# Patient Record
Sex: Female | Born: 1980 | Hispanic: Yes | Marital: Married | Smoking: Never smoker
Health system: Southern US, Community
[De-identification: ages and names within clinical notes are randomized; demographics above are authoritative.]

---

## 2011-12-03 ENCOUNTER — Encounter: Payer: Self-pay | Admitting: Emergency Medicine

## 2011-12-03 ENCOUNTER — Emergency Department (HOSPITAL_BASED_OUTPATIENT_CLINIC_OR_DEPARTMENT_OTHER)
Admission: EM | Admit: 2011-12-03 | Discharge: 2011-12-03 | Disposition: A | Discharge status: Home / Self Care | Payer: Private Health Insurance - Indemnity | Attending: Emergency Medicine | Admitting: Emergency Medicine

## 2011-12-03 ENCOUNTER — Emergency Department (INDEPENDENT_AMBULATORY_CARE_PROVIDER_SITE_OTHER): Payer: Private Health Insurance - Indemnity

## 2011-12-03 DIAGNOSIS — R059 Cough, unspecified: Secondary | ICD-10-CM | POA: Insufficient documentation

## 2011-12-03 DIAGNOSIS — R05 Cough: Secondary | ICD-10-CM | POA: Insufficient documentation

## 2011-12-03 DIAGNOSIS — J069 Acute upper respiratory infection, unspecified: Secondary | ICD-10-CM | POA: Insufficient documentation

## 2011-12-03 MED ORDER — BENZONATATE 200 MG PO CAPS: 200.0000 mg | ORAL_CAPSULE | Freq: Three times a day (TID) | ORAL | Status: AC | PRN

## 2011-12-03 MED ORDER — BENZONATATE 100 MG PO CAPS
200.0000 mg | ORAL_CAPSULE | Freq: Once | ORAL | Status: AC
  Administered 2011-12-03: 200 mg via ORAL
  Filled 2011-12-03: qty 2

## 2011-12-03 NOTE — ED Notes (Signed)
Patient is resting comfortably. Spouse at bedside

## 2011-12-03 NOTE — ED Provider Notes (Signed)
History     CSN: [id number] Arrival date & time: 12/03/2011 10:35 AM   First MD Initiated Contact with Patient 12/03/11 1146      Chief Complaint  Patient presents with  . Cough    (Consider location/radiation/quality/duration/timing/severity/associated sxs/prior treatment) HPI Patient is a 30 yo F who presents with 3-4 days of cough and sore throat.  She has no known sick exposures.  She denies fevers or history of asthma.  She denies CP.  There are no other associated or modifying factors.  History reviewed. No pertinent past medical history.  History reviewed. No pertinent past surgical history.  History reviewed. No pertinent family history.  History  Substance Use Topics  . Smoking status: Never Smoker   . Smokeless tobacco: Not on file  . Alcohol Use: No    OB History    Grav Para Term Preterm Abortions TAB SAB Ect Mult Living                  Review of Systems  Constitutional: Negative.   HENT: Positive for sore throat.   Eyes: Negative.   Respiratory: Positive for cough.   Cardiovascular: Negative.   Gastrointestinal: Negative.   Musculoskeletal: Negative.   Skin: Negative.   Neurological: Negative.   Hematological: Negative.   Psychiatric/Behavioral: Negative.   All other systems reviewed and are negative.    Allergies  Review of patient's allergies indicates no known allergies.  Home Medications   Current Outpatient Rx  Name Route Sig Dispense Refill  . DM-GUAIFENESIN ER 30-600 MG PO TB12 Oral Take 1 tablet by mouth every 12 (twelve) hours.      Marland Kitchen BENZONATATE 200 MG PO CAPS Oral Take 1 capsule (200 mg total) by mouth 3 (three) times daily as needed for cough. 20 capsule 0    BP 114/84  Pulse 84  Temp(Src) 97.7 F (36.5 C) (Oral)  Resp 18  Ht 5\' 2"  (1.575 m)  Wt 130 lb (58.968 kg)  BMI 23.78 kg/m2  SpO2 98%  Physical Exam  Nursing note and vitals reviewed. Constitutional: She is oriented to person, place, and time. She appears  well-developed and well-nourished. No distress.  HENT:  Head: Normocephalic and atraumatic.  Eyes: Conjunctivae and EOM are normal. Pupils are equal, round, and reactive to light.  Neck: Normal range of motion.  Cardiovascular: Normal rate, regular rhythm, normal heart sounds and intact distal pulses.  Exam reveals no gallop and no friction rub.   No murmur heard. Pulmonary/Chest: Effort normal and breath sounds normal. No respiratory distress. She has no wheezes. She has no rales.  Abdominal: Soft. Bowel sounds are normal. She exhibits no distension. There is no tenderness. There is no rebound and no guarding.  Musculoskeletal: Normal range of motion.  Neurological: She is alert and oriented to person, place, and time. No cranial nerve deficit. She exhibits normal muscle tone. Coordination normal.  Skin: Skin is warm and dry. No rash noted.  Psychiatric: She has a normal mood and affect.    ED Course  Procedures (including critical care time)  Labs Reviewed - No data to display Dg Chest 2 View  12/03/2011  *RADIOLOGY REPORT*  Clinical Data: Productive cough, smoking history  CHEST - 2 VIEW  Comparison: None.  Findings: No active infiltrate or effusion is seen.  Mediastinal contours appear normal.  The heart is within normal limits in size. No bony abnormality is noted.  IMPRESSION: No active lung disease.  Original Report Authenticated By: Juline Patch,  M.D.     1. URI (upper respiratory infection)       MDM  Patient was benign in appearance.  She did complain of cough.  CXR was performed and was negative.  The patient was given pyridium and had some improvement.  The patient was given a prescription for this.  She was discharged in good condition.        Cyndra Numbers, MD 12/03/11 2217

## 2011-12-03 NOTE — ED Notes (Signed)
Pt c/o cough with productive sputum pale green in color, denies SOB, denies N/V/D or any pain. States not pregnant. Pt speaks some english but spouse is interpreter. No audible wheezing.

## 2012-12-14 IMAGING — CR DG CHEST 2V
2 series · 2 of 2 positions shown · non-contrast
Comparison: None.

CLINICAL DATA: Productive cough, smoking history

CHEST - 2 VIEW

[w chest pa]
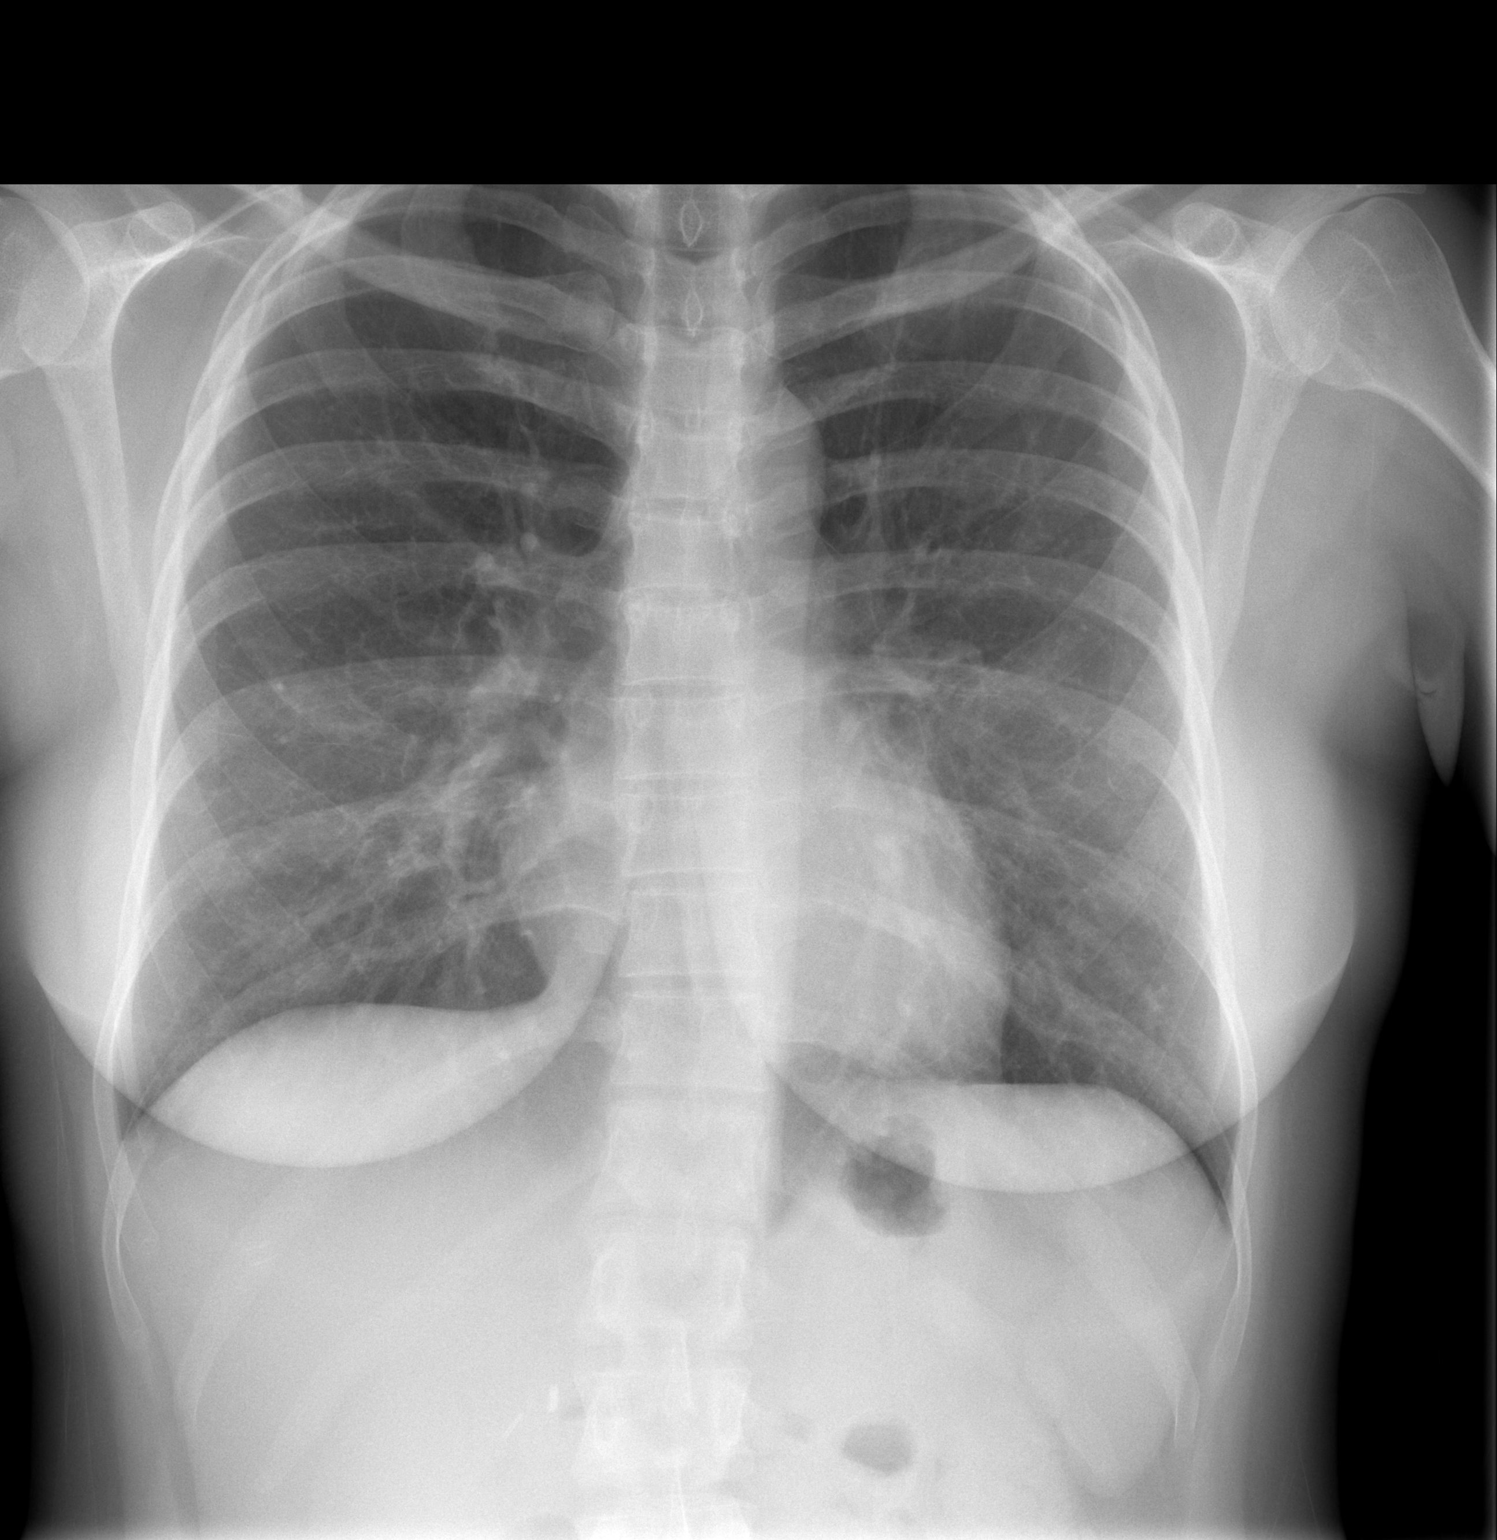

[w chest lat]
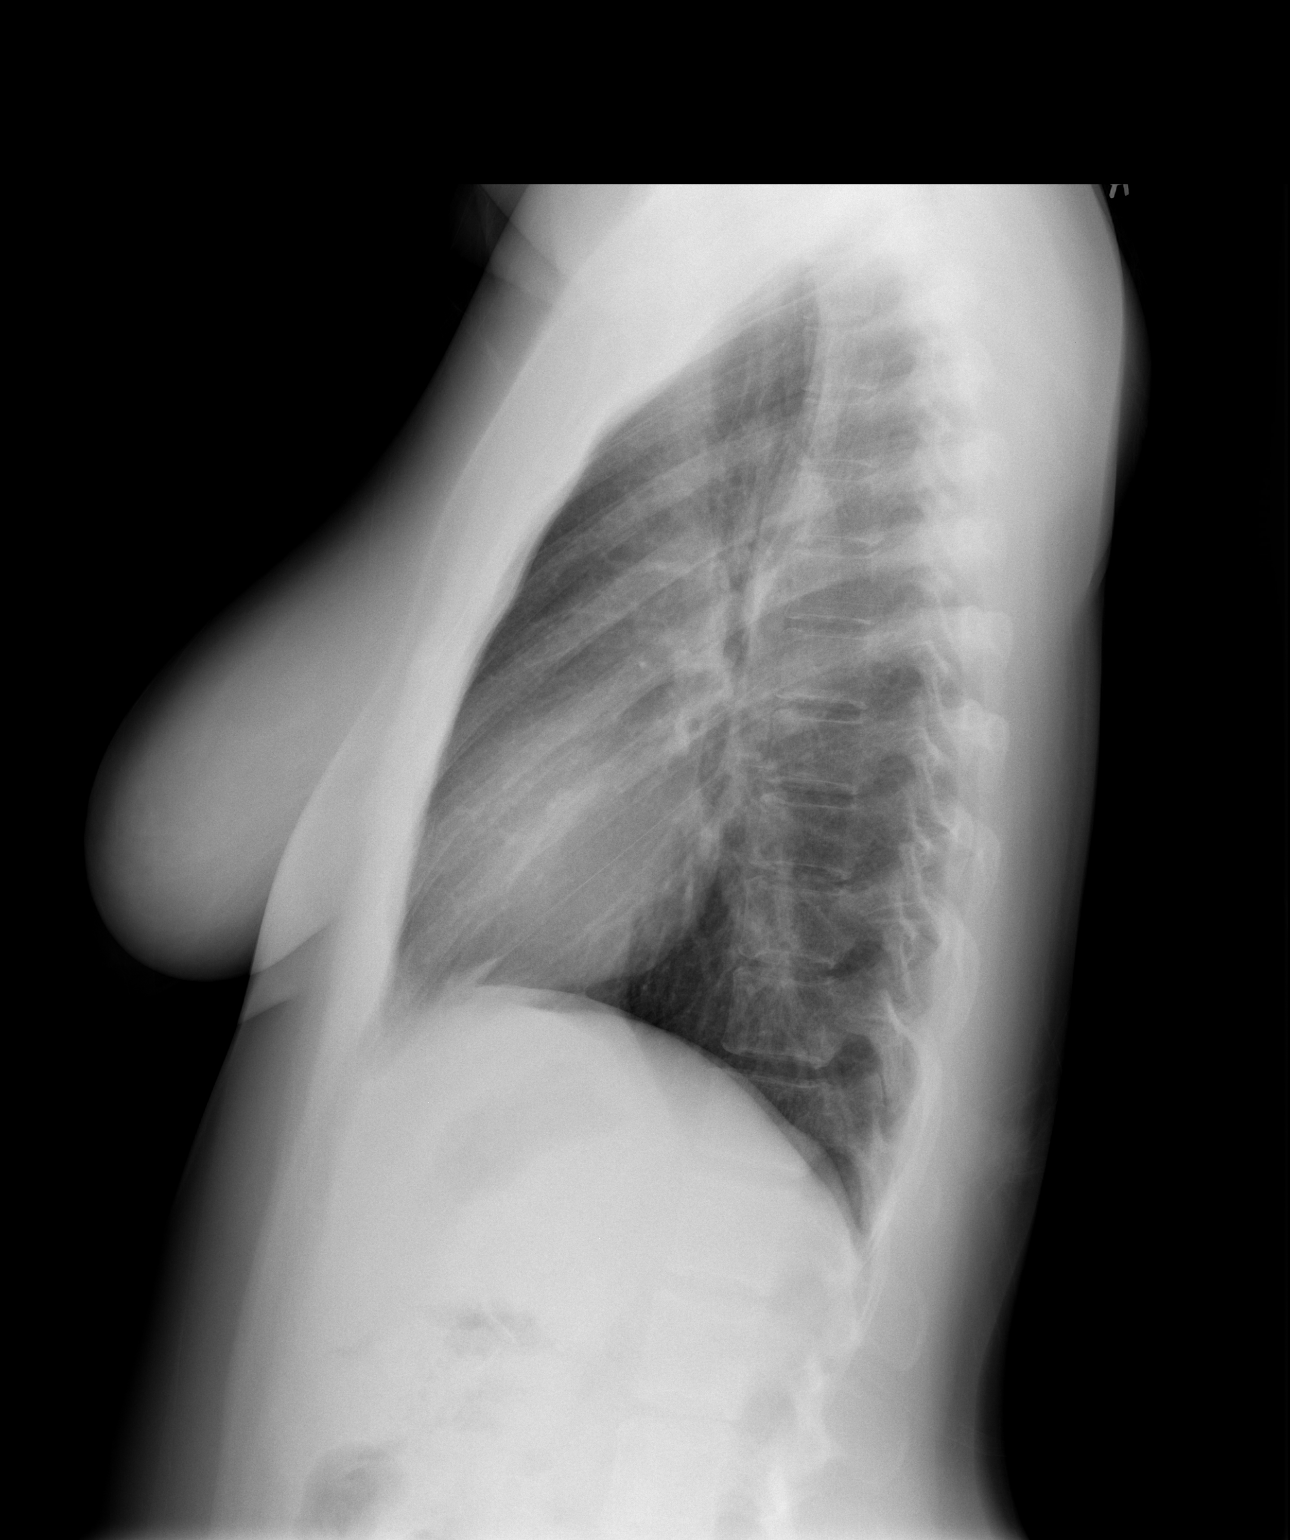

[2 of 2 positions shown; findings below may reference images not displayed]

FINDINGS: No active infiltrate or effusion is seen.  Mediastinal
contours appear normal.  The heart is within normal limits in size.
No bony abnormality is noted.
IMPRESSION: No active lung disease.

## 2018-01-20 ENCOUNTER — Ambulatory Visit: Payer: Self-pay | Admitting: *Deleted

## 2018-01-20 NOTE — Telephone Encounter (Signed)
Pt  Has  No   [place]  pcp       She  Was  Seen  Urgent  Care  5  Days  Ago    Was placed  On  Anti  Biotic    Permission to speak  With significant other  [place]  medara  Given  By  Pt      Pt  Reports  Chest  Pain  With pain in  Shoulders  With  Dizziness  Nausea  And   When  She takes a  Deep  Breath it is   Worse   Pain  Scale  Is  7  Pt advised  To  [place]  To  [place]    -  Advised  Not to  Drive      Reason for Disposition . Taking a deep breath makes pain worse  Answer Assessment - Initial Assessment Questions 1. LOCATION: "Where does it hurt?"        Chest   And  Back   2. RADIATION: "Does the pain go anywhere else?" (e.g., into neck, jaw, arms, back)      Stays    In  Chest      3. ONSET: "When did the chest pain begin?" (Minutes, hours or days)        3   Week       4. PATTERN "Does the pain come and go, or has it been constant since it started?"  "Does it get worse with exertion?"        Stays    Feels  Tired   With   Exertion     5. DURATION: "How long does it last" (e.g., seconds, minutes, hours)        Pt  Is  Constant    6. SEVERITY: "How bad is the pain?"  (e.g., Scale 1-10; mild, moderate, or severe)    - MILD (1-3): doesn't interfere with normal activities     - MODERATE (4-7): interferes with normal activities or awakens from sleep    - SEVERE (8-10): excruciating pain, unable to do any normal activities          7   7. CARDIAC RISK FACTORS: "Do you have any history of heart problems or risk factors for heart disease?" (e.g., prior heart attack, angina; high blood pressure, diabetes, being overweight, high cholesterol, smoking, or strong family history of heart disease)     No  8. PULMONARY RISK FACTORS: "Do you have any history of lung disease?"  (e.g., blood clots in lung, asthma, emphysema, birth control pills)      NO 9. CAUSE: "What do you think is causing the chest pain?"       UNKNOWN   10. OTHER SYMPTOMS: "Do you have any other symptoms?" (e.g., dizziness, nausea,  vomiting, sweating, fever, difficulty breathing, cough)       COUGH     DIZZY   NAUSEA    11. PREGNANCY: "Is there any chance you are pregnant?" "When was your last menstrual period?"       Jan  3  Protocols used: CHEST PAIN-A-AH
